# Patient Record
Sex: Female | Born: 1975 | Race: Black or African American | Hispanic: No | Marital: Married | Smoking: Never smoker
Health system: Southern US, Community
[De-identification: ages and names within clinical notes are randomized; demographics above are authoritative.]

---

## 2015-02-11 HISTORY — PX: HEMORROIDECTOMY: SUR656

## 2015-03-13 ENCOUNTER — Emergency Department (HOSPITAL_BASED_OUTPATIENT_CLINIC_OR_DEPARTMENT_OTHER)
Admission: EM | Admit: 2015-03-13 | Discharge: 2015-03-13 | Discharge status: Against Medical Advice | Payer: Self-pay | Attending: Emergency Medicine | Admitting: Emergency Medicine

## 2015-03-13 ENCOUNTER — Encounter (HOSPITAL_BASED_OUTPATIENT_CLINIC_OR_DEPARTMENT_OTHER): Payer: Self-pay

## 2015-03-13 DIAGNOSIS — R110 Nausea: Secondary | ICD-10-CM | POA: Insufficient documentation

## 2015-03-13 DIAGNOSIS — R1084 Generalized abdominal pain: Secondary | ICD-10-CM | POA: Insufficient documentation

## 2015-03-13 DIAGNOSIS — R197 Diarrhea, unspecified: Secondary | ICD-10-CM | POA: Insufficient documentation

## 2015-03-13 LAB — URINALYSIS, ROUTINE W REFLEX MICROSCOPIC
BILIRUBIN URINE: NEGATIVE
Glucose, UA: NEGATIVE mg/dL
Hgb urine dipstick: NEGATIVE
KETONES UR: NEGATIVE mg/dL
Leukocytes, UA: NEGATIVE
NITRITE: NEGATIVE
PROTEIN: NEGATIVE mg/dL
SPECIFIC GRAVITY, URINE: 1.03 (ref 1.005–1.030)
pH: 6.5 (ref 5.0–8.0)

## 2015-03-13 LAB — PREGNANCY, URINE: Preg Test, Ur: NEGATIVE

## 2015-03-13 NOTE — ED Notes (Signed)
Pt did not answer call for room

## 2015-03-13 NOTE — ED Notes (Signed)
No answer for call back to room.

## 2015-03-13 NOTE — ED Notes (Signed)
Pt did not answer for third call to room

## 2015-03-13 NOTE — ED Notes (Signed)
Generalized abdominal cramping with diarrhea and nausea since this morning.

## 2017-02-04 ENCOUNTER — Emergency Department (HOSPITAL_BASED_OUTPATIENT_CLINIC_OR_DEPARTMENT_OTHER)
Admission: EM | Admit: 2017-02-04 | Discharge: 2017-02-04 | Disposition: A | Discharge status: Home / Self Care | Payer: PRIVATE HEALTH INSURANCE | Attending: Emergency Medicine | Admitting: Emergency Medicine

## 2017-02-04 ENCOUNTER — Encounter (HOSPITAL_BASED_OUTPATIENT_CLINIC_OR_DEPARTMENT_OTHER): Payer: Self-pay | Admitting: Emergency Medicine

## 2017-02-04 DIAGNOSIS — J029 Acute pharyngitis, unspecified: Secondary | ICD-10-CM | POA: Insufficient documentation

## 2017-02-04 DIAGNOSIS — M7918 Myalgia, other site: Secondary | ICD-10-CM | POA: Diagnosis not present

## 2017-02-04 DIAGNOSIS — J111 Influenza due to unidentified influenza virus with other respiratory manifestations: Secondary | ICD-10-CM

## 2017-02-04 DIAGNOSIS — R509 Fever, unspecified: Secondary | ICD-10-CM | POA: Diagnosis not present

## 2017-02-04 DIAGNOSIS — R69 Illness, unspecified: Secondary | ICD-10-CM

## 2017-02-04 DIAGNOSIS — R51 Headache: Secondary | ICD-10-CM | POA: Insufficient documentation

## 2017-02-04 MED ORDER — OSELTAMIVIR PHOSPHATE 75 MG PO CAPS: 75.0000 mg | ORAL_CAPSULE | Freq: Two times a day (BID) | ORAL | 0 refills | Status: DC

## 2017-02-04 MED FILL — OSELTAMIVIR PHOS 75 MG CAP: 75 | 5 days supply | Qty: 10 | Fill #0

## 2017-02-04 NOTE — ED Provider Notes (Signed)
[facility] EMERGENCY DEPARTMENT Provider Note   CSN: [id number] Arrival date & time: 02/04/17  1035     History   Chief Complaint Chief Complaint  Patient presents with  . Generalized Body Aches  . Fever    HPI Mikayla Miller is a 42 y.o. female.  Patient c/o approximately 2 day hx body aches, subjective fever, congestion, scratchy throat, mild headaches. Symptoms gradual onset, persistent. Occasional non prod cough. No vomiting or diarrhea. No dysuria or gu c/o.   Others at work with recent uri symptoms.    The history is provided by the patient.  Fever   Associated symptoms include congestion and sore throat. Pertinent negatives include no chest pain, no vomiting and no headaches.    History reviewed. No pertinent past medical history.  There are no active problems to display for this patient.   Past Surgical History:  Procedure Laterality Date  . HEMORROIDECTOMY  02/11/15    OB History    No data available       Home Medications    Prior to Admission medications   Medication Sig Start Date End Date Taking? Authorizing Provider  docusate sodium (COLACE) 100 MG capsule Take 100 mg by mouth 2 (two) times daily.    [provider]    Family History No family history on file.  Social History Social History   Tobacco Use  . Smoking status: Never Smoker  Substance Use Topics  . Alcohol use: Yes  . Drug use: Not on file     Allergies   Hydrocodone   Review of Systems Review of Systems  Constitutional: Positive for fever. Negative for chills.  HENT: Positive for congestion and sore throat.   Eyes: Negative for discharge and redness.  Respiratory: Negative for shortness of breath.   Cardiovascular: Negative for chest pain.  Gastrointestinal: Negative for abdominal pain and vomiting.  Genitourinary: Negative for dysuria and flank pain.  Musculoskeletal: Negative for back pain and neck pain.  Skin: Negative for rash.    Neurological: Negative for headaches.  Hematological: Does not bruise/bleed easily.  Psychiatric/Behavioral: Negative for confusion.     Physical Exam Updated Vital Signs BP 128/80   Pulse 91   Temp 98 F (36.7 C)   Ht 1.575 m (5\' 2" )   Wt 88 kg (194 lb)   SpO2 100%   BMI 35.48 kg/m   Physical Exam  Constitutional: She appears well-developed and well-nourished. No distress.  HENT:  Mouth/Throat: Oropharynx is clear and moist.  Eyes: Conjunctivae are normal. No scleral icterus.  Neck: Neck supple. No tracheal deviation present.  No stiffness or rigidity  Cardiovascular: Normal rate, regular rhythm, normal heart sounds and intact distal pulses. Exam reveals no gallop and no friction rub.  No murmur heard. Pulmonary/Chest: Effort normal and breath sounds normal. No respiratory distress.  Abdominal: Soft. Normal appearance and bowel sounds are normal. She exhibits no distension. There is no tenderness.  Musculoskeletal: She exhibits no edema or tenderness.  Lymphadenopathy:    She has no cervical adenopathy.  Neurological: She is alert.  Skin: Skin is warm and dry. No rash noted. She is not diaphoretic.  Psychiatric: She has a normal mood and affect.  Nursing note and vitals reviewed.    ED Treatments / Results  Labs (all labs ordered are listed, but only abnormal results are displayed) Labs Reviewed - No data to display  EKG  EKG Interpretation None       Radiology No results found.  Procedures Procedures (including critical care time)  Medications Ordered in ED Medications - No data to display   Initial Impression / Assessment and Plan / ED Course  I have reviewed the triage vital signs and the nursing notes.  Pertinent labs & imaging results that were available during my care of the patient were reviewed by me and considered in my medical decision making (see chart for details).  Patients symptoms and exam most c/w viral or flu-like illness.  As  recent onset, will give rx tamiflu.  Reviewed nursing notes and prior charts for additional history.     Final Clinical Impressions(s) / ED Diagnoses   Final diagnoses:  None    ED Discharge Orders    None       Cathren Laine, MD 02/04/17 1058

## 2017-02-04 NOTE — ED Triage Notes (Signed)
Pt states she started feeling bad 2 days ago. C/o fatigue, SHOB, and myalgias. Feels hot and having chills but not captured a fever. Headache intermittently

## 2017-02-04 NOTE — Discharge Instructions (Signed)
It was our pleasure to provide your ER care today - we hope that you feel better.  Rest. Drink plenty of fluids.   Take tamiflu as prescribed.  Take acetaminophen and/or ibuprofen as need for aches/pain.  Follow up with primary care doctor in 1 week if symptoms fail to improve/resolve.  Return to ER if worse, new symptoms, increased trouble breathing, other concern.

## 2017-02-04 NOTE — ED Notes (Signed)
Pt understood dc material. NAD noted. Script and work excuse given at dc 

## 2017-06-03 ENCOUNTER — Other Ambulatory Visit: Payer: Self-pay

## 2017-06-03 ENCOUNTER — Encounter (HOSPITAL_BASED_OUTPATIENT_CLINIC_OR_DEPARTMENT_OTHER): Payer: Self-pay | Admitting: *Deleted

## 2017-06-03 DIAGNOSIS — K5900 Constipation, unspecified: Secondary | ICD-10-CM | POA: Diagnosis present

## 2017-06-03 DIAGNOSIS — K5641 Fecal impaction: Secondary | ICD-10-CM | POA: Insufficient documentation

## 2017-06-03 NOTE — ED Triage Notes (Signed)
Constipation and rectal pain. She used a citrate and fiber with no BM.

## 2017-06-04 ENCOUNTER — Emergency Department (HOSPITAL_BASED_OUTPATIENT_CLINIC_OR_DEPARTMENT_OTHER)
Admission: EM | Admit: 2017-06-04 | Discharge: 2017-06-04 | Disposition: A | Discharge status: Home / Self Care | Payer: No Typology Code available for payment source | Attending: Emergency Medicine | Admitting: Emergency Medicine

## 2017-06-04 ENCOUNTER — Emergency Department (HOSPITAL_BASED_OUTPATIENT_CLINIC_OR_DEPARTMENT_OTHER): Payer: No Typology Code available for payment source

## 2017-06-04 DIAGNOSIS — K5641 Fecal impaction: Secondary | ICD-10-CM

## 2017-06-04 LAB — PREGNANCY, URINE: Preg Test, Ur: NEGATIVE

## 2017-06-04 NOTE — ED Provider Notes (Signed)
[facility] Provider Note: Lowella Dell, MD, FACEP  CSN: [id number] MRN: [id number] ARRIVAL: 06/03/17 at 2130 ROOM: [facility]   CHIEF COMPLAINT  Constipation   HISTORY OF PRESENT ILLNESS  06/04/17 2:01 AM Mikayla Miller is a 42 y.o. female who has not had a bowel movement in 3 days which is atypical for her.  She has taken mag citrate, a fiber supplement and another unspecified over-the-counter laxative without relief.  She is having moderate pain in her rectum which she states feels like her rectum is being pushed open by retained stool.  She is having some gurgling and cramping in her abdomen as well.  She denies nausea or vomiting.  She does not have a problem with chronic constipation.    History reviewed. No pertinent past medical history.  Past Surgical History:  Procedure Laterality Date  . HEMORROIDECTOMY  02/11/15    No family history on file.  Social History   Tobacco Use  . Smoking status: Never Smoker  . Smokeless tobacco: Never Used  Substance Use Topics  . Alcohol use: Yes  . Drug use: Never    Prior to Admission medications   Medication Sig Start Date End Date Taking? Authorizing Provider  docusate sodium (COLACE) 100 MG capsule Take 100 mg by mouth 2 (two) times daily.    [provider]  oseltamivir (TAMIFLU) 75 MG capsule Take 1 capsule (75 mg total) by mouth every 12 (twelve) hours. 02/04/17   Cathren Laine, MD    Allergies Hydrocodone   REVIEW OF SYSTEMS  Negative except as noted here or in the History of Present Illness.   PHYSICAL EXAMINATION  Initial Vital Signs Blood pressure 120/83, pulse 88, temperature 98.2 F (36.8 C), temperature source Oral, resp. rate 18, height  (1.575 m), weight 88.9 kg (196 lb), last menstrual period 05/21/2017, SpO2 100 %.  Examination General: Well-developed, well-nourished female in no acute distress; appearance consistent with age of record HENT: normocephalic; atraumatic Eyes:  pupils equal, round and reactive to light; extraocular muscles intact Neck: supple Heart: regular rate and rhythm Lungs: clear to auscultation bilaterally Abdomen: soft; nondistended; mild diffuse tenderness; no masses or hepatosplenomegaly; bowel sounds hyperactive Rectal: External hemorrhoids without thrombosis; normal sphincter tone; high impaction Extremities: No deformity; full range of motion; pulses normal Neurologic: Awake, alert and oriented; motor function intact in all extremities and symmetric; no facial droop Skin: Warm and dry Psychiatric: Normal mood and affect   RESULTS  Summary of this visit's results, reviewed by myself:   EKG Interpretation  Date/Time:    Ventricular Rate:    PR Interval:    QRS Duration:   QT Interval:    QTC Calculation:   R Axis:     Text Interpretation:        Laboratory Studies: Results for orders placed or performed during the hospital encounter of 06/04/17 (from the past 24 hour(s))  Pregnancy, urine     Status: None   Collection Time: 06/04/17 12:40 AM  Result Value Ref Range   Preg Test, Ur NEGATIVE NEGATIVE   Imaging Studies: Dg Abdomen 1 View  Result Date: 06/04/2017 CLINICAL DATA:  Chronic constipation EXAM: ABDOMEN - 1 VIEW COMPARISON:  None. FINDINGS: Nonobstructed bowel-gas pattern. Mild to moderate retained feces in the rectum. No abnormal calcifications. IMPRESSION: Nonobstructed gas pattern. Mild to moderate retained feces in the rectum. Electronically Signed   By: Jasmine Pang M.D.   On: 06/04/2017 02:22    ED COURSE and MDM  Nursing notes and initial vitals signs, including pulse oximetry, reviewed.  Vitals:   06/03/17 2138 06/03/17 2139 06/04/17 0011  BP: 127/79  120/83  Pulse: 99  88  Resp: 20  18  Temp: 98.2 F (36.8 C)  98.2 F (36.8 C)  TempSrc: Oral  Oral  SpO2: 100%  100%  Weight:  88.9 kg (196 lb)   Height:   (1.575 m)    2:55 AM Patient feels significantly better after passage of large  amount of stool following soapsuds enema.  PROCEDURES    ED DIAGNOSES     ICD-10-CM   1. Fecal impaction in rectum (HCC) K56.41        Jontae Sonier, Jonny Ruiz, MD 06/04/17 [phone]

## 2017-08-24 ENCOUNTER — Other Ambulatory Visit (HOSPITAL_BASED_OUTPATIENT_CLINIC_OR_DEPARTMENT_OTHER): Payer: Self-pay | Admitting: Family Medicine

## 2017-08-24 ENCOUNTER — Ambulatory Visit (HOSPITAL_BASED_OUTPATIENT_CLINIC_OR_DEPARTMENT_OTHER)
Admission: RE | Admit: 2017-08-24 | Discharge: 2017-08-24 | Disposition: A | Discharge status: Home / Self Care | Payer: PRIVATE HEALTH INSURANCE | Source: Ambulatory Visit | Attending: Family Medicine | Admitting: Family Medicine

## 2017-08-24 DIAGNOSIS — M7989 Other specified soft tissue disorders: Secondary | ICD-10-CM | POA: Insufficient documentation

## 2017-08-24 DIAGNOSIS — M79604 Pain in right leg: Secondary | ICD-10-CM

## 2017-08-24 DIAGNOSIS — M79661 Pain in right lower leg: Secondary | ICD-10-CM

## 2018-11-15 ENCOUNTER — Emergency Department (HOSPITAL_BASED_OUTPATIENT_CLINIC_OR_DEPARTMENT_OTHER)
Admission: EM | Admit: 2018-11-15 | Discharge: 2018-11-15 | Disposition: A | Discharge status: Home / Self Care | Payer: No Typology Code available for payment source | Attending: Emergency Medicine | Admitting: Emergency Medicine

## 2018-11-15 ENCOUNTER — Encounter (HOSPITAL_BASED_OUTPATIENT_CLINIC_OR_DEPARTMENT_OTHER): Payer: Self-pay | Admitting: Emergency Medicine

## 2018-11-15 ENCOUNTER — Other Ambulatory Visit: Payer: Self-pay

## 2018-11-15 DIAGNOSIS — R6884 Jaw pain: Secondary | ICD-10-CM | POA: Insufficient documentation

## 2018-11-15 MED ORDER — CLINDAMYCIN HCL 150 MG PO CAPS: 300.0000 mg | ORAL_CAPSULE | Freq: Four times a day (QID) | ORAL | 0 refills | Status: AC

## 2018-11-15 MED ORDER — MELOXICAM 15 MG PO TABS: 15.0000 mg | ORAL_TABLET | Freq: Every day | ORAL | 0 refills | Status: AC

## 2018-11-15 MED FILL — CLINDAMYCIN HCL 150 MG CAPS: 150 | 7 days supply | Qty: 56 | Fill #0

## 2018-11-15 MED FILL — MELOXICAM 15 MG TABLET: 15 | 10 days supply | Qty: 10 | Fill #0

## 2018-11-15 NOTE — ED Triage Notes (Signed)
Pt having left sided gum pain, radiates to neck and ear for one month.     No fever.  No chills.  Pt seen at dentist and PCP, continues to have pain regardless of OTC remedies.

## 2018-11-15 NOTE — ED Notes (Signed)
Pt left before signing for discharge d/t reporting to dentist appt.

## 2018-11-15 NOTE — ED Notes (Signed)
ED Provider at bedside. 

## 2018-11-15 NOTE — ED Notes (Addendum)
Pt tearful, reports left side ear, neck and dental/jaw pain x 1 month. Pt made multiple visits for treatment with no improvement for symptoms

## 2018-11-15 NOTE — ED Provider Notes (Addendum)
[facility] EMERGENCY DEPARTMENT Provider Note   CSN: [id number] Arrival date & time: 11/15/18  1032     History   Chief Complaint Chief Complaint  Patient presents with  . Jaw Pain  . Otalgia    HPI Mikayla Miller is a 43 y.o. female.     Patient presents the emergency department today with complaint of left-sided facial pain.  Patient states that this is been ongoing for approximately 1 month.  She states that she was initially evaluated by her dentist who did not find anything wrong.  They did prescribe a course of Augmentin which the patient completed.  Patient went to her primary care doctor "have her ears checked".  This exam was normal.  Patient was told to complete her antibiotics.  She has had waxing and waning left-sided facial pain that originates in the left mandibular area since this time.  She has not followed up with anyone else.  She states that she is taking an excessive amount of Goody powder and ibuprofen at home with temporary relief of symptoms.  She denies any fever, vision change, ear pain, runny nose or bloody nose.  No difficulty swallowing, shortness of breath or breathing.  She denies any facial swelling.     No past medical history on file.  There are no active problems to display for this patient.   Past Surgical History:  Procedure Laterality Date  . HEMORROIDECTOMY  02/11/15     OB History   No obstetric history on file.      Home Medications    Prior to Admission medications   Medication Sig Start Date End Date Taking? Authorizing Provider  docusate sodium (COLACE) 100 MG capsule Take 100 mg by mouth 2 (two) times daily.    [provider]    Family History No family history on file.  Social History Social History   Tobacco Use  . Smoking status: Never Smoker  . Smokeless tobacco: Never Used  Substance Use Topics  . Alcohol use: Yes  . Drug use: Never     Allergies   Patient has no known allergies.    Review of Systems Review of Systems  Constitutional: Negative for fever.  HENT: Positive for dental problem. Negative for ear pain, facial swelling, sore throat and trouble swallowing.        Positive for facial pain  Eyes: Negative for photophobia.  Respiratory: Negative for shortness of breath and stridor.   Musculoskeletal: Negative for neck pain.  Skin: Negative for color change.  Neurological: Positive for headaches.     Physical Exam Updated Vital Signs BP (!) 157/97   Pulse 99   Temp 98.9 F (37.2 C) (Oral)   Resp 18   Ht 5\' 2"  (1.575 m)   Wt 78 kg   LMP 11/08/2018   SpO2 100%   BMI 31.46 kg/m   Physical Exam Vitals signs and nursing note reviewed.  Constitutional:      Appearance: She is well-developed.  HENT:     Head: Normocephalic and atraumatic.     Jaw: No trismus.     Right Ear: Tympanic membrane, ear canal and external ear normal.     Left Ear: Tympanic membrane, ear canal and external ear normal.     Nose: Nose normal.     Mouth/Throat:     Dentition: Abnormal dentition. Dental caries present. No dental abscesses.     Pharynx: Uvula midline. No uvula swelling.     Tonsils: No tonsillar  abscesses.     Comments: Patient does not have any gross abscess or palpable abscess.  She has tenderness which is reproduced over the left mandibular molars along the gumline.  Patient winces in pain when this area is pressed.  I do not feel any enlarged salivary gland, any masses, any abscesses in this area. Eyes:     Conjunctiva/sclera: Conjunctivae normal.  Neck:     Musculoskeletal: Normal range of motion and neck supple.     Comments: No neck swelling or Ludwig's angina Lymphadenopathy:     Cervical: No cervical adenopathy.  Skin:    General: Skin is warm and dry.  Neurological:     Mental Status: She is alert.      ED Treatments / Results  Labs (all labs ordered are listed, but only abnormal results are displayed) Labs Reviewed - No data to display   EKG None  Radiology No results found.  Procedures Procedures (including critical care time)  Medications Ordered in ED Medications - No data to display   Initial Impression / Assessment and Plan / ED Course  I have reviewed the triage vital signs and the nursing notes.  Pertinent labs & imaging results that were available during my care of the patient were reviewed by me and considered in my medical decision making (see chart for details).        Patient seen and examined.  Patient with ongoing left-sided dental pain.  She will be prescribed clindamycin and meloxicam for pain.  At this point, symptoms seem to be most likely dental in nature, however there are no gross abscesses today.  Neuropathic pain in the face and salivary gland etiology are also possible.  I do not see or feel any enlargement of the soft tissue suspicious for infection and therefore I do not feel that any advanced imaging is indicated at this time.  Patient appears well, nontoxic.  She will require additional symptom control.  Encouraged dental and PCP follow-up.  Recommended ENT or neurology follow-up if they are unable to determine etiology of her pain.  Vital signs reviewed and are as follows: BP (!) 157/97   Pulse 99   Temp 98.9 F (37.2 C) (Oral)   Resp 18   Ht 5\' 2"  (1.575 m)   Wt 78 kg   LMP 11/08/2018   SpO2 100%   BMI 31.46 kg/m   Prior to discharge, patient reportedly called her dentist who could see her if she went over right this minute.  She left without discharge instructions.  I have sent her prescriptions for clindamycin meloxicam to the pharmacy here.  Final Clinical Impressions(s) / ED Diagnoses   Final diagnoses:  Jaw pain   Patient with left-sided facial and jaw pain as above.  Unclear etiology at this point.  Do not suspect an emergent condition requiring further evaluation or treatment here in the emergency department at this time.  ED Discharge Orders         Ordered     clindamycin (CLEOCIN) 150 MG capsule  Every 6 hours     11/15/18 1248    meloxicam (MOBIC) 15 MG tablet  Daily     11/15/18 1248           Carlisle Cater, PA-C 11/15/18 1315    Carlisle Cater, PA-C 11/15/18 1316    Blanchie Dessert, MD 11/15/18 1600

## 2019-04-28 IMAGING — DX DG ABDOMEN 1V
1 series · 1 of 1 positions shown · non-contrast
Comparison: None.

CLINICAL DATA: Chronic constipation

EXAM:
ABDOMEN - 1 VIEW

[abdomen kub]
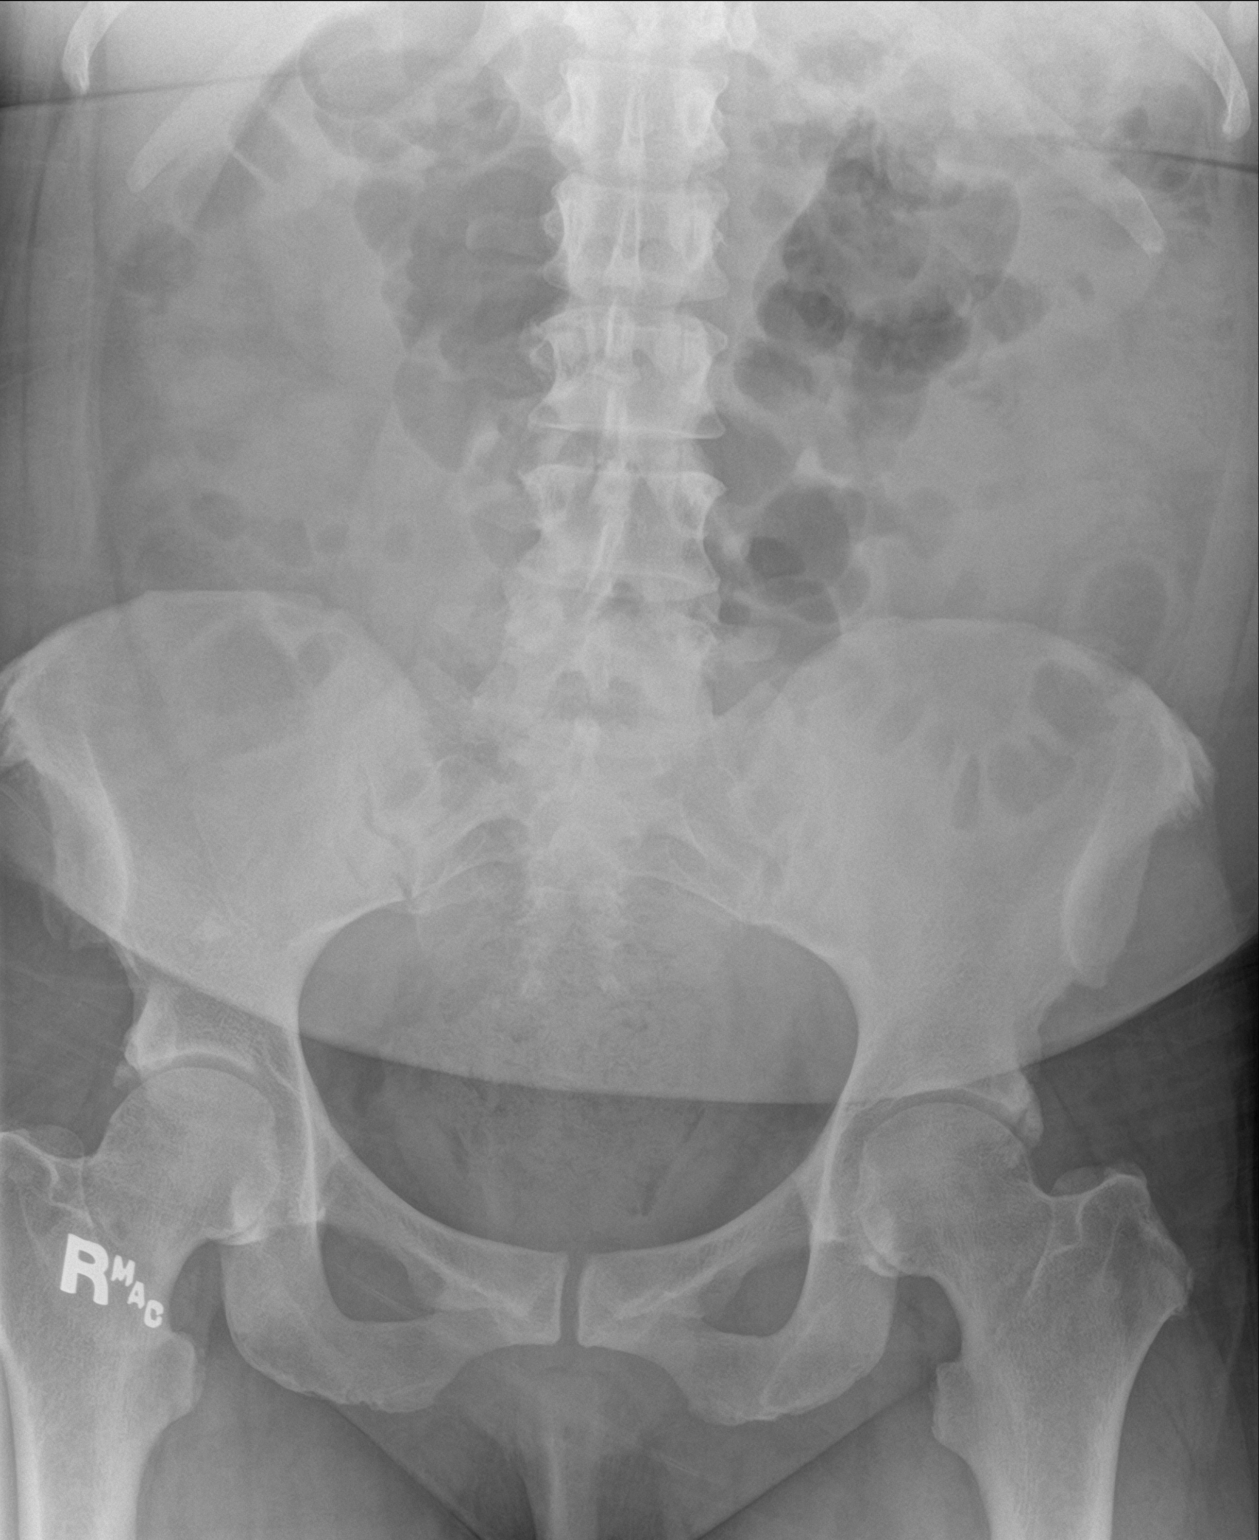

[1 of 1 positions shown; findings below may reference images not displayed]

FINDINGS: Nonobstructed bowel-gas pattern. Mild to moderate retained feces in
the rectum. No abnormal calcifications.
IMPRESSION: Nonobstructed gas pattern. Mild to moderate retained feces in the
rectum.

## 2019-07-28 ENCOUNTER — Other Ambulatory Visit: Payer: Self-pay

## 2019-07-28 ENCOUNTER — Emergency Department (HOSPITAL_BASED_OUTPATIENT_CLINIC_OR_DEPARTMENT_OTHER)
Admission: EM | Admit: 2019-07-28 | Discharge: 2019-07-28 | Disposition: A | Discharge status: Home / Self Care | Payer: 59 | Attending: Emergency Medicine | Admitting: Emergency Medicine

## 2019-07-28 ENCOUNTER — Encounter (HOSPITAL_BASED_OUTPATIENT_CLINIC_OR_DEPARTMENT_OTHER): Payer: Self-pay | Admitting: *Deleted

## 2019-07-28 ENCOUNTER — Emergency Department (HOSPITAL_BASED_OUTPATIENT_CLINIC_OR_DEPARTMENT_OTHER): Payer: 59

## 2019-07-28 DIAGNOSIS — Y999 Unspecified external cause status: Secondary | ICD-10-CM | POA: Insufficient documentation

## 2019-07-28 DIAGNOSIS — W108XXA Fall (on) (from) other stairs and steps, initial encounter: Secondary | ICD-10-CM | POA: Insufficient documentation

## 2019-07-28 DIAGNOSIS — S8002XA Contusion of left knee, initial encounter: Secondary | ICD-10-CM | POA: Diagnosis not present

## 2019-07-28 DIAGNOSIS — W19XXXA Unspecified fall, initial encounter: Secondary | ICD-10-CM

## 2019-07-28 DIAGNOSIS — M25462 Effusion, left knee: Secondary | ICD-10-CM

## 2019-07-28 DIAGNOSIS — Y9301 Activity, walking, marching and hiking: Secondary | ICD-10-CM | POA: Insufficient documentation

## 2019-07-28 DIAGNOSIS — S8012XA Contusion of left lower leg, initial encounter: Secondary | ICD-10-CM | POA: Diagnosis not present

## 2019-07-28 DIAGNOSIS — Y92009 Unspecified place in unspecified non-institutional (private) residence as the place of occurrence of the external cause: Secondary | ICD-10-CM | POA: Diagnosis not present

## 2019-07-28 DIAGNOSIS — S8992XA Unspecified injury of left lower leg, initial encounter: Secondary | ICD-10-CM | POA: Diagnosis present

## 2019-07-28 MED ORDER — IBUPROFEN 800 MG PO TABS
800.0000 mg | ORAL_TABLET | Freq: Once | ORAL | Status: AC
  Administered 2019-07-28: 800 mg via ORAL
  Filled 2019-07-28: qty 1

## 2019-07-28 NOTE — ED Notes (Signed)
Ice packs applied to left ankle and left knee

## 2019-07-28 NOTE — ED Triage Notes (Signed)
She was walking up steps tripped and fell. Injury to her left knee and ankle. To ED via EMS.

## 2019-07-28 NOTE — Discharge Instructions (Addendum)
You may alternate Tylenol 1000 mg every 6 hours as needed for pain and Ibuprofen 800 mg every 8 hours as needed for pain.  Please take Ibuprofen with food.  Do not take more than 4000 mg of Tylenol (acetaminophen) in a 24 hour period.  

## 2019-07-28 NOTE — ED Provider Notes (Signed)
TIME SEEN: 11:16 PM  CHIEF COMPLAINT: Fall  HPI: Patient is a 44 year old female who tripped and fell up the stairs just prior to arrival.  Injured her left knee and left ankle.  Able to bear weight but states it is uncomfortable.  No numbness or focal weakness.  No head injury.  No neck or back pain.  ROS: See HPI Constitutional: no fever  Eyes: no drainage  ENT: no runny nose   Cardiovascular:  no chest pain  Resp: no SOB  GI: no vomiting GU: no dysuria Integumentary: no rash  Allergy: no hives  Musculoskeletal: no leg swelling  Neurological: no slurred speech ROS otherwise negative  PAST MEDICAL HISTORY/PAST SURGICAL HISTORY:  History reviewed. No pertinent past medical history.  MEDICATIONS:  Prior to Admission medications   Medication Sig Start Date End Date Taking? Authorizing Provider  ergocalciferol (VITAMIN D2) 1.25 MG (50000 UT) capsule Take by mouth. 05/18/19  Yes [provider]  iron polysaccharides (NIFEREX) 150 MG capsule TAKE 1 CAPSULE (150 MG TOTAL) BY MOUTH TWO (2) TIMES A DAY. 04/19/18  Yes [provider]  norethindrone-ethinyl estradiol (LOESTRIN) 1-20 MG-MCG tablet Take 1 tablet by mouth daily. 12/05/18  Yes [provider]  clindamycin (CLEOCIN) 150 MG capsule Take 2 capsules (300 mg total) by mouth every 6 (six) hours. 11/15/18   Carlisle Cater, PA-C  docusate sodium (COLACE) 100 MG capsule Take 100 mg by mouth 2 (two) times daily.    [provider]  meloxicam (MOBIC) 15 MG tablet Take 1 tablet (15 mg total) by mouth daily. 11/15/18   Carlisle Cater, PA-C    ALLERGIES:  No Known Allergies  SOCIAL HISTORY:  Social History   Tobacco Use  . Smoking status: Never Smoker  . Smokeless tobacco: Never Used  Substance Use Topics  . Alcohol use: Yes    FAMILY HISTORY: No family history on file.  EXAM: BP 124/72   Pulse 82   Temp 99 F (37.2 C) (Oral)   Resp 20   Ht 5\' 2"  (1.575 m)   Wt 74.4 kg   LMP 07/21/2019  Comment: tubes tied and oral contraceptives  SpO2 100%   BMI 30.00 kg/m  CONSTITUTIONAL: Alert and responds appropriately to questions. Well-appearing; well-nourished HEAD: Normocephalic, atraumatic EYES: Conjunctivae clear, pupils appear equal ENT: normal nose; moist mucous membranes NECK: Normal range of motion CARD: Regular rate and rhythm RESP: Normal chest excursion without splinting or tachypnea; no hypoxia or respiratory distress, speaking full sentences ABD/GI: non-distended EXT: Normal ROM in all joints, pain to palpation along the anterior left knee with small joint effusion no redness or, tender to palpation diffusely over the left ankle without deformity, compartments in the left lower extremity soft, no calf tenderness or calf swelling, 2+ left DP pulse, no tenderness over the left foot or proximal left fibular head, normal sensation in the left lower extremity, small abrasion to the anterior left knee SKIN: Normal color for age and race, no rashes on exposed skin NEURO: Moves all extremities equally, normal speech, no facial asymmetry noted PSYCH: The patient's mood and manner are appropriate. Grooming and personal hygiene are appropriate.  MEDICAL DECISION MAKING: Patient here with mechanical fall.  X-rays show joint effusion of the left knee but no other acute abnormality.  Placed in Ace wrap for comfort and compression.  Provided with crutches to use as needed.  Discharged with prescription of ibuprofen.  She declines anything stronger at this time.  No other sign of traumatic injury  on exam.  Provided with work note.  Neurovascular intact distally.  At this time, I do not feel there is any life-threatening condition present. I have reviewed, interpreted and discussed all results (EKG, imaging, lab, urine as appropriate) and exam findings with patient/family. I have reviewed nursing notes and appropriate previous records.  I feel the patient is safe to be discharged home without  further emergent workup and can continue workup as an outpatient as needed. Discussed usual and customary return precautions. Patient/family verbalize understanding and are comfortable with this plan.  Outpatient follow-up has been provided as needed. All questions have been answered.  Aujanae Mccullum was evaluated in Emergency Department on 07/28/2019 for the symptoms described in the history of present illness. She was evaluated in the context of the global COVID-19 pandemic, which necessitated consideration that the patient might be at risk for infection with the SARS-CoV-2 virus that causes COVID-19. Institutional protocols and algorithms that pertain to the evaluation of patients at risk for COVID-19 are in a state of rapid change based on information released by regulatory bodies including the CDC and federal and state organizations. These policies and algorithms were followed during the patient's care in the ED.      Tiann Saha, Layla Maw, DO 07/29/19 [phone]

## 2019-07-31 ENCOUNTER — Telehealth: Payer: Self-pay | Admitting: Family Medicine

## 2019-07-31 NOTE — Telephone Encounter (Signed)
Please let Dr. Magnus Ivan know this, as this was one of the patient's from the ED over the weekend.

## 2019-07-31 NOTE — Telephone Encounter (Signed)
FYI

## 2019-07-31 NOTE — Telephone Encounter (Signed)
Called pt to set appt. Patient states knee is much better and appt is not needed at this time. Told pt to call if any changes so we can get her seen. Pt agreed. Call ended.

## 2019-07-31 NOTE — Telephone Encounter (Signed)
Called patient concerning scheduling an appointment for left knee pain with dr Prince Rome     Per patient swelling has gone down and she is not experiencing any pain   (Per patient no appointment needed)

## 2021-06-20 IMAGING — DX DG KNEE COMPLETE 4+V*L*
4 series · 4 of 4 positions shown · non-contrast
Comparison: None.

CLINICAL DATA: Fall

EXAM:
LEFT KNEE - COMPLETE 4+ VIEW

[knee ap]
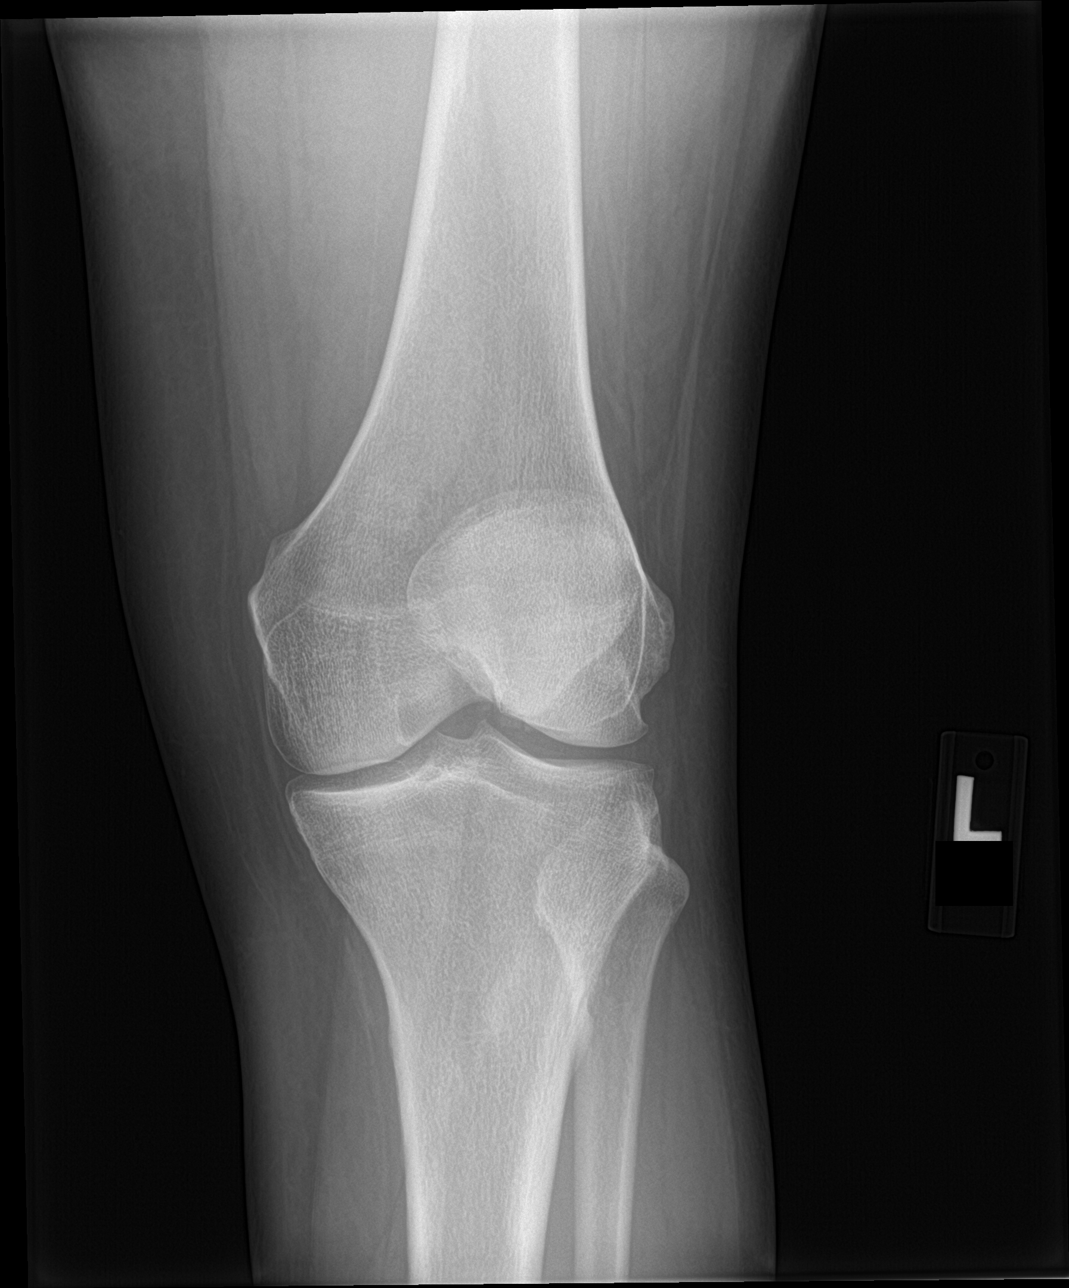

[knee lat]
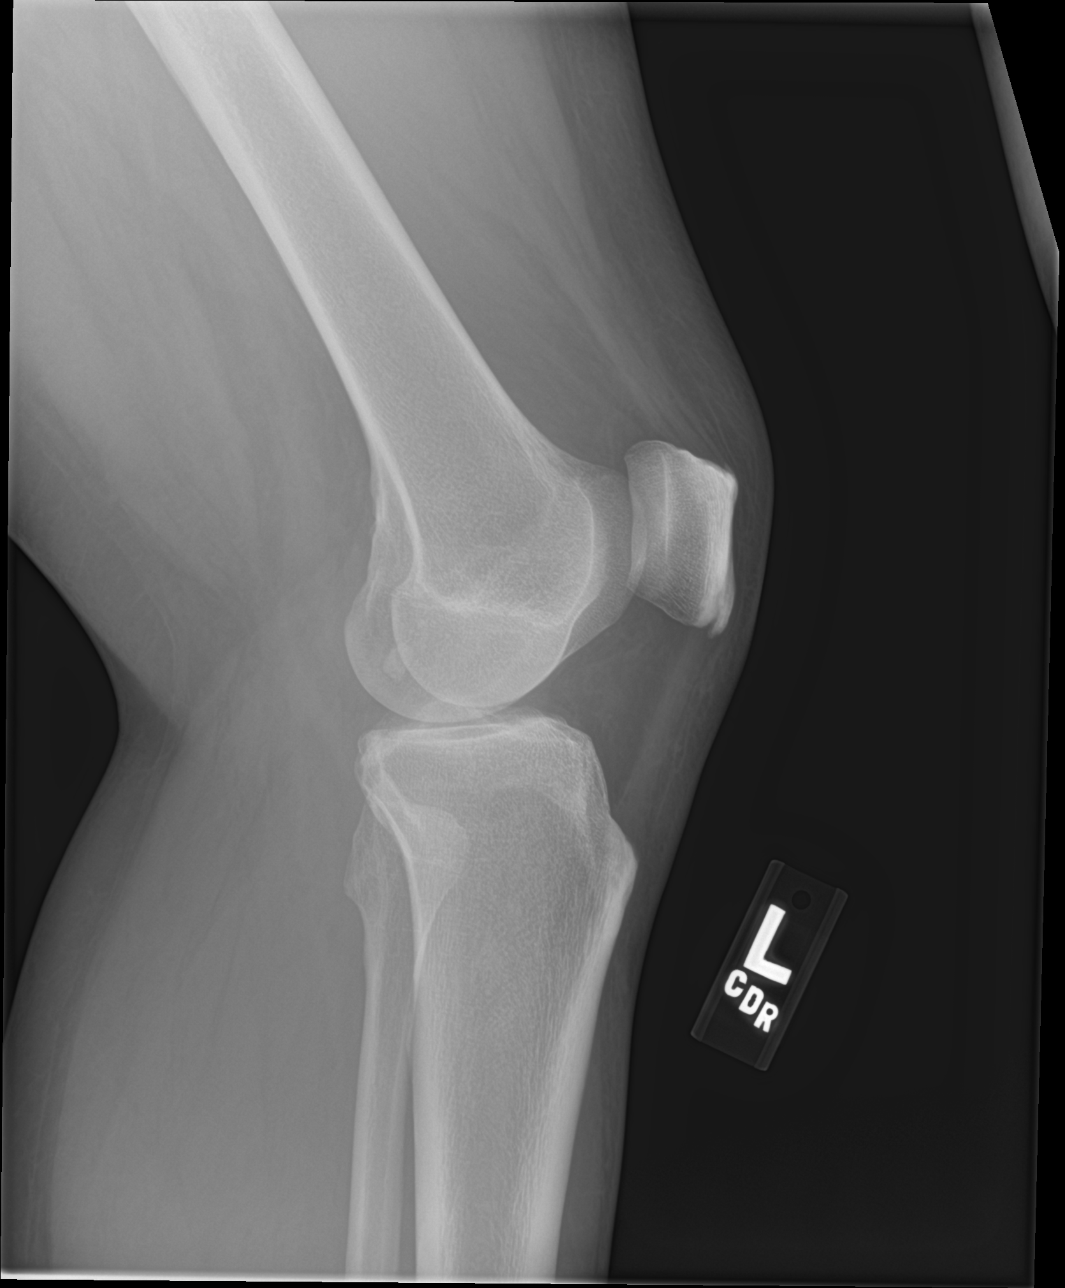

[knee obl (1 of 2)]
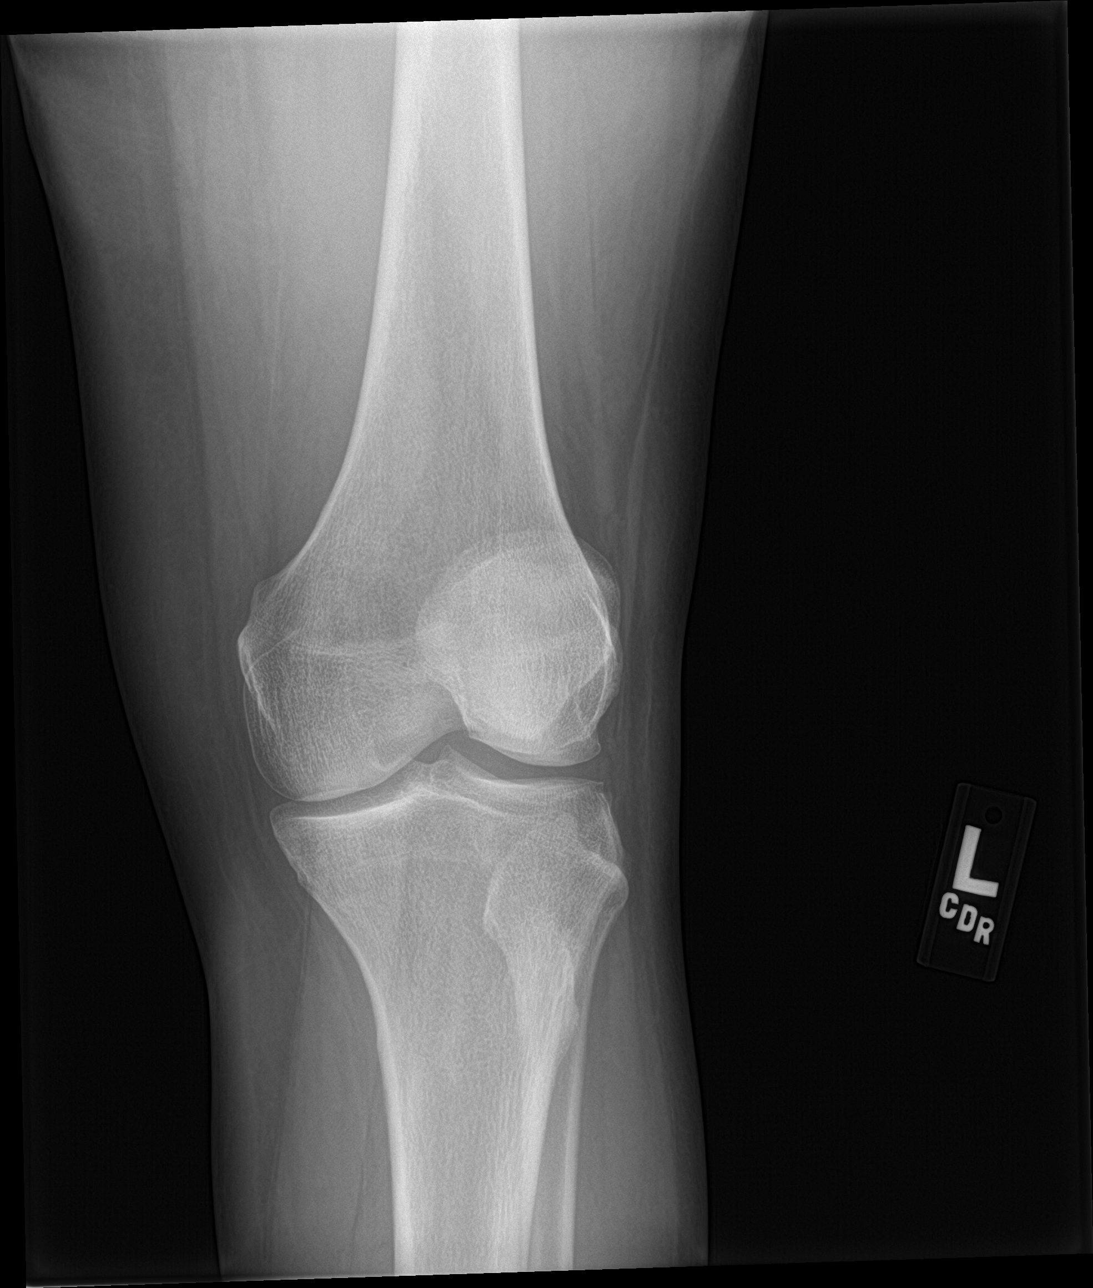

[knee obl (2 of 2)]
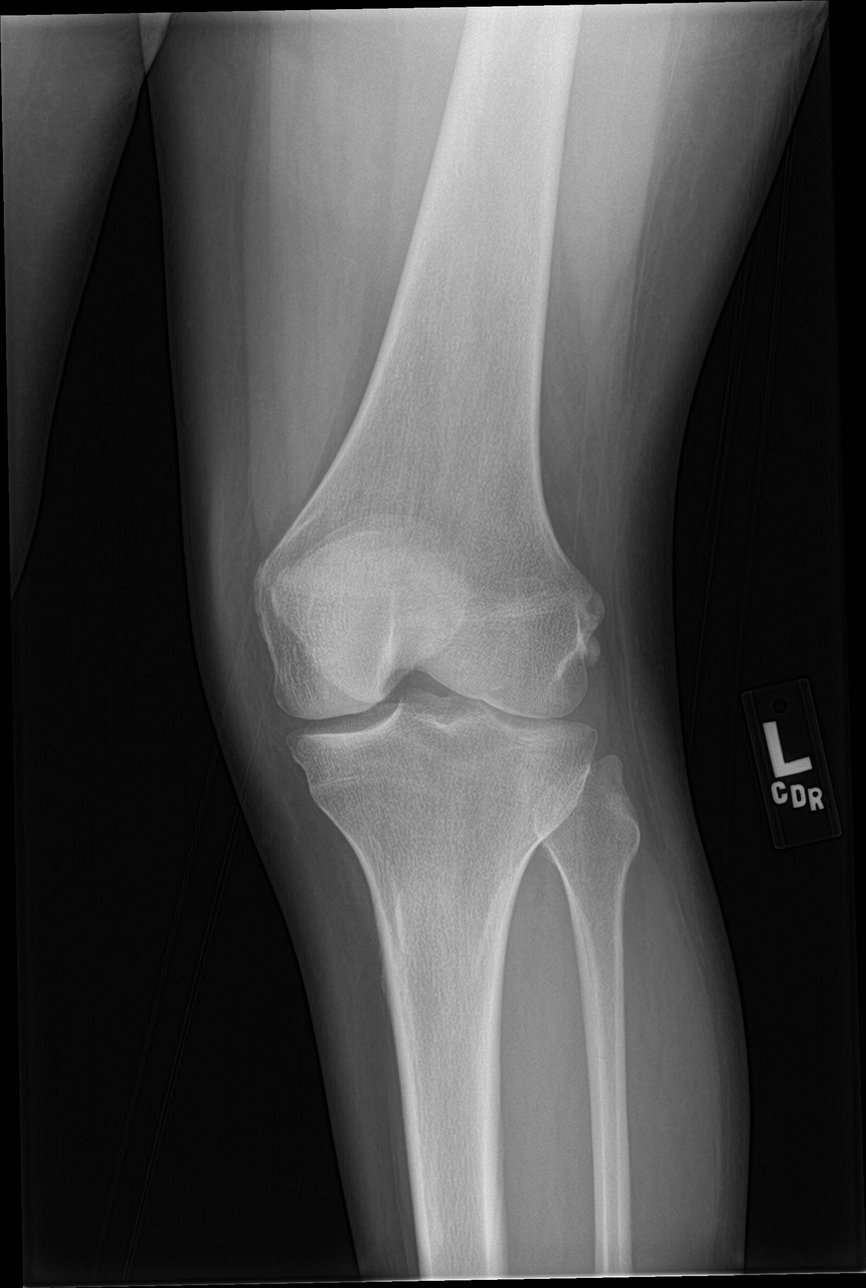

[4 of 4 positions shown; findings below may reference images not displayed]

FINDINGS: Alignment is anatomic. There is no acute fracture. A joint effusion
is present. Joint spaces are preserved.
IMPRESSION: No acute fracture.  Joint effusion.
